# Patient Record
Sex: Female | Born: 1990 | Race: White | Hispanic: No | Marital: Single | Smoking: Current every day smoker
Health system: Southern US, Community
[De-identification: ages and names within clinical notes are randomized; demographics above are authoritative.]

---
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
## 2011-04-15 ENCOUNTER — Observation Stay: Payer: Self-pay | Admitting: Advanced Practice Midwife

## 2011-04-30 ENCOUNTER — Observation Stay: Payer: Self-pay | Admitting: Advanced Practice Midwife

## 2011-05-09 ENCOUNTER — Inpatient Hospital Stay: Payer: Self-pay

## 2016-10-08 ENCOUNTER — Ambulatory Visit (INDEPENDENT_AMBULATORY_CARE_PROVIDER_SITE_OTHER): Payer: Commercial Managed Care - PPO | Admitting: Certified Nurse Midwife

## 2016-10-08 ENCOUNTER — Encounter: Payer: Self-pay | Admitting: Certified Nurse Midwife

## 2016-10-08 VITALS — BP 107/60 | HR 72 | Ht 62.0 in | Wt 178.1 lb

## 2016-10-08 DIAGNOSIS — Z30433 Encounter for removal and reinsertion of intrauterine contraceptive device: Secondary | ICD-10-CM

## 2016-10-08 DIAGNOSIS — Z01419 Encounter for gynecological examination (general) (routine) without abnormal findings: Secondary | ICD-10-CM

## 2016-10-08 DIAGNOSIS — R1031 Right lower quadrant pain: Secondary | ICD-10-CM

## 2016-10-08 DIAGNOSIS — Z7689 Persons encountering health services in other specified circumstances: Secondary | ICD-10-CM

## 2016-10-08 DIAGNOSIS — M545 Low back pain: Secondary | ICD-10-CM

## 2016-10-08 DIAGNOSIS — Z113 Encounter for screening for infections with a predominantly sexual mode of transmission: Secondary | ICD-10-CM

## 2016-10-08 LAB — POCT URINALYSIS DIPSTICK
Bilirubin, UA: NEGATIVE
Blood, UA: NEGATIVE
Glucose, UA: NEGATIVE
KETONES UA: NEGATIVE
Leukocytes, UA: NEGATIVE
Nitrite, UA: NEGATIVE
PH UA: 7
Protein, UA: NEGATIVE
SPEC GRAV UA: 1.01
Urobilinogen, UA: NEGATIVE

## 2016-10-08 LAB — POCT URINE PREGNANCY: Preg Test, Ur: NEGATIVE

## 2016-10-08 NOTE — Patient Instructions (Signed)

## 2016-10-08 NOTE — Progress Notes (Signed)
New pt is here with c/o severe pain in RLQ radiating to back. Has a Mirena in since 2011.LPS 2 yrs.

## 2016-10-08 NOTE — Progress Notes (Signed)
ANNUAL PREVENTATIVE CARE GYN  ENCOUNTER NOTE  Subjective:       Erika Meadows is a 26 y.o. G40P1001 female here for a routine annual gynecologic exam.  Current complaints: right lower quadrant pain and lower back pain x two (2) weeks. She is accompanied to today's visit by her sister.   This pain has increased over the last two weeks, but is manageable with OTC medications and warm baths.   Mirena IUD was placed 06/2010 and pt would like it removed and reinserted today.   She reports a history of an abnormal PAP, but "never followed up about it".   Erika Meadows works full time at [employer] and has a seven (27) year old.   Denies difficulty breathing or respiratory distress, visual changes, chest pain, vaginal bleeding, vaginal itching, unusual vaginal discharge, dysuria, unexplained vaginal bleeding and leg pain or swelling.    Gynecologic History No LMP recorded. Patient is not currently having periods (Reason: IUD).   Contraception: condoms and IUD   Last Pap: 2016. Results were: abnormal  Obstetric History OB History  Gravida Para Term Preterm AB Living  1 1 1     1   SAB TAB Ectopic Multiple Live Births          1    # Outcome Date GA Lbr Len/2nd Weight Sex Delivery Anes PTL Lv  1 Term 2011    M Vag-Spont  N LIV      History reviewed. No pertinent past medical history.  History reviewed. No pertinent surgical history.  No current outpatient prescriptions on file prior to visit.   No current facility-administered medications on file prior to visit.     Not on File  Social History   Social History  . Marital status: Single    Spouse name: N/A  . Number of children: N/A  . Years of education: N/A   Occupational History  . Not on file.   Social History Main Topics  . Smoking status: Never Smoker  . Smokeless tobacco: Never Used  . Alcohol use No  . Drug use: No  . Sexual activity: Yes    Birth control/ protection: IUD   Other Topics Concern  . Not  on file   Social History Narrative  . No narrative on file    Family History  Problem Relation Age of Onset  . Diabetes Maternal Grandmother   . Thyroid disease Maternal Grandmother     The following portions of the patient's history were reviewed and updated as appropriate: allergies, current medications, past family history, past medical history, past social history, past surgical history and problem list.  Review of Systems  ROS negative except for as noted above. Information obtained from patient.    Objective:   BP 107/60   Pulse 72   Ht 5\' 2"  (1.575 m)   Wt 178 lb 2 oz (80.8 kg)   BMI 32.58 kg/m    CONSTITUTIONAL: Well-developed, well-nourished female in no acute distress.   PSYCHIATRIC: Normal mood and affect. Normal behavior. Normal judgment and thought content.  NEUROLGIC: Alert and oriented to person, place, and time. Normal muscle tone coordination. No cranial nerve deficit noted.  HENT:  Normocephalic, atraumatic, External right and left ear normal.   EYES: Conjunctivae and EOM are normal. Pupils are equal, round, and reactive to light.   NECK: Normal range of motion, supple, no masses.  Normal thyroid.   SKIN: Skin is warm and dry. No rash noted. Not diaphoretic. No erythema. No  pallor.  CARDIOVASCULAR: Normal heart rate noted, regular rhythm, no murmur.  RESPIRATORY: Clear to auscultation bilaterally. Effort and breath sounds normal, no problems with respiration noted.  BREASTS: Symmetric in size. No masses, skin changes, nipple drainage, or lymphadenopathy.  ABDOMEN: Soft, normal bowel sounds, no distention noted.  No tenderness, rebound or guarding.   PELVIC:  External Genitalia: Normal  Vagina: Normal  Cervix: Normal  Uterus: Normal  Adnexa: Normal  MUSCULOSKELETAL: Normal range of motion. No tenderness.  No cyanosis, clubbing, or edema.  2+ distal pulses.  LYMPHATIC: No Axillary, Supraclavicular, or Inguinal Adenopathy.  Assessment:    Annual gynecologic examination 26 y.o.   Contraception: IUD   Obesity 1   Problem List Items Addressed This Visit    None    Visit Diagnoses    Well woman exam    -  Primary   Relevant Orders   Basic metabolic panel   CBC   Thyroid Panel With TSH   Vitamin D 1,25 dihydroxy   Hemoglobin A1c   Lipid panel   Right lower quadrant abdominal pain       Acute right-sided low back pain, with sciatica presence unspecified       Screening for STDs (sexually transmitted diseases)       Encounter to establish care       Encounter for IUD removal          Plan:  Pap: Pap, Reflex if ASCUS   Urine dip and NuSwab collected  IUD removal and reinsertion (see note below)  Labs: Lipid 1, FBS, TSH, Hemoglobin A1C and Vit D Level""See orders, pt will return when fasting   Routine preventative health maintenance measures emphasized: Exercise/Diet/Weight control and Safe Sex  RTC x 1 year for annual exam   Gunnar Bulla, CNM   Erika Meadows is a 26 y.o. year old G32P1001 Caucasian female who presents for removal and replacement of a Mirena IUD. She was given informed consent for removal and reinsertion of her Mirena. Her Mirena was placed 06/2010, No LMP recorded. Patient is not currently having periods (Reason: IUD)., and her pregnancy test today was negative.   The risks and benefits of the method and placement have been thouroughly reviewed with the patient and all questions were answered.  Specifically the patient is aware of failure rate of 09/998, expulsion of the IUD and of possible perforation.  The patient is aware of irregular bleeding due to the method and understands the incidence of irregular bleeding diminishes with time.  Signed copy of informed consent in chart.   No LMP recorded. Patient is not currently having periods (Reason: IUD). BP 107/60   Pulse 72   Ht 5\' 2"  (1.575 m)   Wt 178 lb 2 oz (80.8 kg)   BMI 32.58 kg/m  No results found for this or  any previous visit (from the past 24 hour(s)).   Appropriate time out taken. A medium plastic speculum was placed in the vagina.  The cervix was visualized, prepped using Betadine. The strings were visible. They were grasped and the Mirena was easily removed. The cervix was then grasped with a single-tooth tenaculum. The uterus was found to be neutral and it sounded to 7 cm.  Mirena IUD placed per manufacturer's recommendations without complications. The strings were trimmed to 3 cm.  The patient tolerated the procedure well.   The patient was given post procedure instructions, including signs and symptoms of infection and to check for the strings after each  menses or each month, and refraining from intercourse or anything in the vagina for 3 days.  She was given a Mirena care card with date Mirena placed, and date Mirena to be removed.   Gunnar BullaJenkins Michelle Edwyna Meadows, CNM

## 2016-10-09 ENCOUNTER — Other Ambulatory Visit: Payer: Self-pay | Admitting: *Deleted

## 2016-10-09 DIAGNOSIS — Z01419 Encounter for gynecological examination (general) (routine) without abnormal findings: Secondary | ICD-10-CM

## 2016-10-11 ENCOUNTER — Other Ambulatory Visit: Payer: Commercial Managed Care - PPO

## 2016-10-11 DIAGNOSIS — Z01419 Encounter for gynecological examination (general) (routine) without abnormal findings: Secondary | ICD-10-CM

## 2016-10-11 NOTE — Telephone Encounter (Signed)
Mrs Erika Meadows, would you contact the pt and let her know that her PAP was normal. Thanks, JML

## 2016-10-15 LAB — VITAMIN D 1,25 DIHYDROXY
Vitamin D 1, 25 (OH)2 Total: 77 pg/mL
Vitamin D2 1, 25 (OH)2: <10 pg/mL
Vitamin D3 1, 25 (OH)2: 77 pg/mL

## 2016-10-15 LAB — CBC
Hematocrit: 42.2 % (ref 34.0–46.6)
Hemoglobin: 14.2 g/dL (ref 11.1–15.9)
MCH: 31.4 pg (ref 26.6–33.0)
MCHC: 33.6 g/dL (ref 31.5–35.7)
MCV: 93 fL (ref 79–97)
PLATELETS: 225 10*3/uL (ref 150–379)
RBC: 4.52 x10E6/uL (ref 3.77–5.28)
RDW: 13.6 % (ref 12.3–15.4)
WBC: 7.5 10*3/uL (ref 3.4–10.8)

## 2016-10-15 LAB — BASIC METABOLIC PANEL
BUN/Creatinine Ratio: 17 (ref 9–23)
BUN: 11 mg/dL (ref 6–20)
CO2: 22 mmol/L (ref 18–29)
Calcium: 9.6 mg/dL (ref 8.7–10.2)
Chloride: 100 mmol/L (ref 96–106)
Creatinine, Ser: 0.63 mg/dL (ref 0.57–1.00)
GFR calc Af Amer: 145 mL/min/{1.73_m2} (ref >59)
GFR calc non Af Amer: 126 mL/min/{1.73_m2} (ref >59)
GLUCOSE: 95 mg/dL (ref 65–99)
POTASSIUM: 4.2 mmol/L (ref 3.5–5.2)
SODIUM: 138 mmol/L (ref 134–144)

## 2016-10-15 LAB — LIPID PANEL
CHOL/HDL RATIO: 3 ratio (ref 0.0–4.4)
Cholesterol, Total: 163 mg/dL (ref 100–199)
HDL: 54 mg/dL (ref >39)
LDL CALC: 99 mg/dL (ref 0–99)
TRIGLYCERIDES: 51 mg/dL (ref 0–149)
VLDL CHOLESTEROL CAL: 10 mg/dL (ref 5–40)

## 2016-10-15 LAB — THYROID PANEL WITH TSH
FREE THYROXINE INDEX: 4.1 (ref 1.2–4.9)
T3 UPTAKE RATIO: 38 % (ref 24–39)
T4 TOTAL: 10.7 ug/dL (ref 4.5–12.0)
TSH: 0.478 u[IU]/mL (ref 0.450–4.500)

## 2016-10-15 LAB — HEMOGLOBIN A1C
Est. average glucose Bld gHb Est-mCnc: 100 mg/dL
Hgb A1c MFr Bld: 5.1 % (ref 4.8–5.6)

## 2016-11-22 ENCOUNTER — Encounter: Payer: Commercial Managed Care - PPO | Admitting: Certified Nurse Midwife

## 2016-11-23 ENCOUNTER — Ambulatory Visit (INDEPENDENT_AMBULATORY_CARE_PROVIDER_SITE_OTHER): Payer: Commercial Managed Care - PPO | Admitting: Certified Nurse Midwife

## 2016-11-23 ENCOUNTER — Encounter: Payer: Self-pay | Admitting: Certified Nurse Midwife

## 2016-11-23 VITALS — BP 108/67 | HR 100 | Wt 171.5 lb

## 2016-11-23 DIAGNOSIS — Z30431 Encounter for routine checking of intrauterine contraceptive device: Secondary | ICD-10-CM | POA: Diagnosis not present

## 2016-11-23 DIAGNOSIS — M545 Low back pain, unspecified: Secondary | ICD-10-CM

## 2016-11-23 DIAGNOSIS — N898 Other specified noninflammatory disorders of vagina: Secondary | ICD-10-CM

## 2016-11-23 DIAGNOSIS — R300 Dysuria: Secondary | ICD-10-CM

## 2016-11-23 LAB — POCT URINALYSIS DIPSTICK
BILIRUBIN UA: NEGATIVE
Glucose, UA: NEGATIVE
LEUKOCYTES UA: NEGATIVE
Nitrite, UA: NEGATIVE
PH UA: 5 (ref 5.0–8.0)
Spec Grav, UA: 1.025 (ref 1.030–1.035)
Urobilinogen, UA: NEGATIVE (ref ?–2.0)

## 2016-11-23 LAB — POCT URINE PREGNANCY: Preg Test, Ur: NEGATIVE

## 2016-11-23 MED ORDER — NITROFURANTOIN MONOHYD MACRO 100 MG PO CAPS
100.0000 mg | ORAL_CAPSULE | Freq: Two times a day (BID) | ORAL | 1 refills | Status: DC
Start: 2016-11-23 — End: 2020-05-17

## 2016-11-23 NOTE — Patient Instructions (Signed)

## 2016-11-23 NOTE — Progress Notes (Signed)
Pt is here for an IUD string check. 

## 2016-11-26 NOTE — Progress Notes (Signed)
GYN ENCOUNTER NOTE  Subjective:       Erika Meadows is a 26 y.o. G64P1001 female is here for IUD string check and gynecologic evaluation of the following issues: dysuria, low back pain, and vaginal discharge.   Erika Meadows was seen 10/09/2016 for her routine annual exam and IUD removal and reinsertion. She was also evaluated for abdominal pain and back pain at that time.   She reports relief of symptoms for approximately one (1) month. For the last three (3) days she reports intermittent achy low back pain, dysuria, dark urine, and brownish vaginal discharge.   Erika Meadows was concerned that she was pregnancy and took four (4) home pregnancy test that all failed to result. She requests a UPT in office today.   Denies difficulty breathing or respiratory distress, chest pain, vaginal bleeding, and leg pain or swelling.   Gynecologic History  Patient's last menstrual period was 10/13/2016 (approximate).   Contraception: IUD   Last Pap: 10/09/2016. Results were: normal  Obstetric History OB History  Gravida Para Term Preterm AB Living  1 1 1     1   SAB TAB Ectopic Multiple Live Births          1    # Outcome Date GA Lbr Len/2nd Weight Sex Delivery Anes PTL Lv  1 Term 2011    M Vag-Spont  N LIV      History reviewed. No pertinent past medical history.  History reviewed. No pertinent surgical history.  No current outpatient prescriptions on file prior to visit.   No current facility-administered medications on file prior to visit.     Not on File  Social History   Social History  . Marital status: Single    Spouse name: N/A  . Number of children: N/A  . Years of education: N/A   Occupational History  . Not on file.   Social History Main Topics  . Smoking status: Never Smoker  . Smokeless tobacco: Never Used  . Alcohol use No  . Drug use: No  . Sexual activity: Yes    Birth control/ protection: IUD   Other Topics Concern  . Not on file   Social History  Narrative  . No narrative on file    Family History  Problem Relation Age of Onset  . Diabetes Maternal Grandmother   . Thyroid disease Maternal Grandmother     The following portions of the patient's history were reviewed and updated as appropriate: allergies, current medications, past family history, past medical history, past social history, past surgical history and problem list.  Review of Systems  Review of Systems - Negative except as noted above.  History obtained from the patient  Objective:   BP 108/67   Pulse 100   Wt 171 lb 8 oz (77.8 kg)   LMP 10/13/2016 (Approximate) Comment: spotted x 1  BMI 31.37 kg/m    CONSTITUTIONAL: Well-developed, well-nourished female in no acute distress.   ABDOMEN: Soft, non distended; Non tender.  No Organomegaly. No CVAT.   PELVIC:  External Genitalia: Normal  Vagina: Tan discharge present  Cervix: Normal, IUD strings present  Uterus: Normal size, shape,consistency, mobile  Adnexa: Normal  Negative UPT  Urine dipstick shows positive for red blood cells, protein, ketones.  Assessment:   1. Dysuria  - POCT urinalysis dipstick - POCT urine pregnancy - Urine culture  2. Acute midline low back pain without sciatica  - POCT urine pregnancy - Urine culture  3. IUD check up  4. Vaginal discharge  - NuSwab Vaginitis Plus (VG+)  Plan:   Urine culture and NuSwab collected, will contact pt with results  Rx Macrobid, see orders  Reviewed red flag symptoms and when to call including PAINS acronym  RTC if symptoms worsen or fail to improve RTC as needed   Gunnar BullaJenkins Michelle Assata Juncaj, CNM

## 2016-11-28 LAB — URINE CULTURE

## 2016-11-29 ENCOUNTER — Other Ambulatory Visit: Payer: Self-pay | Admitting: Certified Nurse Midwife

## 2016-11-29 LAB — NUSWAB VAGINITIS PLUS (VG+)
Atopobium vaginae: HIGH Score — AB
BVAB 2: HIGH Score — AB
CANDIDA GLABRATA, NAA: NEGATIVE
Candida albicans, NAA: NEGATIVE
Chlamydia trachomatis, NAA: NEGATIVE
Megasphaera 1: HIGH Score — AB
NEISSERIA GONORRHOEAE, NAA: NEGATIVE
TRICH VAG BY NAA: NEGATIVE

## 2016-11-29 MED ORDER — METRONIDAZOLE 500 MG PO TABS: 500.0000 mg | ORAL_TABLET | Freq: Two times a day (BID) | ORAL | 0 refills | Status: AC

## 2017-06-13 ENCOUNTER — Ambulatory Visit (INDEPENDENT_AMBULATORY_CARE_PROVIDER_SITE_OTHER): Payer: Commercial Managed Care - PPO | Admitting: Certified Nurse Midwife

## 2017-06-13 ENCOUNTER — Encounter: Payer: Self-pay | Admitting: Certified Nurse Midwife

## 2017-06-13 VITALS — BP 117/78 | HR 71 | Wt 164.1 lb

## 2017-06-13 DIAGNOSIS — R300 Dysuria: Secondary | ICD-10-CM | POA: Diagnosis not present

## 2017-06-13 DIAGNOSIS — N760 Acute vaginitis: Secondary | ICD-10-CM

## 2017-06-13 DIAGNOSIS — B9689 Other specified bacterial agents as the cause of diseases classified elsewhere: Secondary | ICD-10-CM | POA: Diagnosis not present

## 2017-06-13 LAB — POCT URINALYSIS DIPSTICK
Bilirubin, UA: NEGATIVE
GLUCOSE UA: NEGATIVE
KETONES UA: NEGATIVE
Leukocytes, UA: NEGATIVE
Nitrite, UA: NEGATIVE
PROTEIN UA: NEGATIVE
RBC UA: NEGATIVE
SPEC GRAV UA: 1.015 (ref 1.010–1.025)
UROBILINOGEN UA: 0.2 U/dL
pH, UA: 6.5 (ref 5.0–8.0)

## 2017-06-13 MED ORDER — METRONIDAZOLE 500 MG PO TABS: 500.0000 mg | ORAL_TABLET | Freq: Two times a day (BID) | ORAL | 0 refills | Status: AC

## 2017-06-13 MED ORDER — SECNIDAZOLE 2 G PO PACK: 1.0000 | PACK | Freq: Once | ORAL | 0 refills | Status: AC

## 2017-06-13 NOTE — Patient Instructions (Signed)

## 2017-06-14 NOTE — Progress Notes (Signed)
SUBJECTIVE:   26 y.o. female complains of clear, copious, malodorous, thin and yellow vaginal discharge for two (2) week(s).  Denies abnormal vaginal bleeding or significant pelvic pain or fever. No UTI symptoms. Denies history of known exposure to STD. Denies relief with home treatment measures.   OBJECTIVE:   BP 117/78   Pulse 71   Wt 164 lb 1.6 oz (74.4 kg)   BMI 30.01 kg/m   She appears well, afebrile. Abdomen: benign, soft, nontender, no masses. Pelvic Exam: VULVA: vulvar erythema present, VAGINA: vaginal discharge - white and malodorous, WET MOUNT done - results: KOH done, clue cells. Urine dipstick: negative for all components.  ASSESSMENT:  bacterial vaginosis  PLAN:   Treatment: Flagyl 500 BID x 7 days, abstain from coitus during course of treatment and see orders for additional treatments  RTC x prn if symptoms persist or worsen.   Gunnar Bulla, CNM

## 2017-09-03 HISTORY — PX: BREAST LUMPECTOMY: SHX2

## 2017-09-19 ENCOUNTER — Encounter: Payer: Commercial Managed Care - PPO | Admitting: Certified Nurse Midwife

## 2018-05-27 ENCOUNTER — Other Ambulatory Visit: Payer: Self-pay | Admitting: Student

## 2018-05-27 DIAGNOSIS — N6459 Other signs and symptoms in breast: Secondary | ICD-10-CM

## 2018-05-30 ENCOUNTER — Ambulatory Visit
Admission: RE | Admit: 2018-05-30 | Discharge: 2018-05-30 | Disposition: A | Discharge status: Home / Self Care | Payer: Commercial Managed Care - PPO | Source: Ambulatory Visit | Attending: Student | Admitting: Student

## 2018-05-30 DIAGNOSIS — N6459 Other signs and symptoms in breast: Secondary | ICD-10-CM | POA: Diagnosis not present

## 2018-06-03 ENCOUNTER — Other Ambulatory Visit: Payer: Self-pay | Admitting: Student

## 2018-06-03 ENCOUNTER — Other Ambulatory Visit: Payer: Self-pay

## 2018-06-03 DIAGNOSIS — R928 Other abnormal and inconclusive findings on diagnostic imaging of breast: Secondary | ICD-10-CM

## 2018-06-03 DIAGNOSIS — N6310 Unspecified lump in the right breast, unspecified quadrant: Secondary | ICD-10-CM

## 2018-06-11 ENCOUNTER — Ambulatory Visit
Admission: RE | Admit: 2018-06-11 | Discharge: 2018-06-11 | Disposition: A | Discharge status: Home / Self Care | Payer: Commercial Managed Care - PPO | Source: Ambulatory Visit | Attending: Student | Admitting: Student

## 2018-06-11 DIAGNOSIS — R928 Other abnormal and inconclusive findings on diagnostic imaging of breast: Secondary | ICD-10-CM | POA: Insufficient documentation

## 2018-06-11 DIAGNOSIS — N6310 Unspecified lump in the right breast, unspecified quadrant: Secondary | ICD-10-CM

## 2018-06-13 LAB — SURGICAL PATHOLOGY

## 2020-04-06 ENCOUNTER — Other Ambulatory Visit: Payer: Self-pay | Admitting: Physician Assistant

## 2020-04-06 DIAGNOSIS — S83221A Peripheral tear of medial meniscus, current injury, right knee, initial encounter: Secondary | ICD-10-CM

## 2020-04-27 ENCOUNTER — Ambulatory Visit
Admission: RE | Admit: 2020-04-27 | Discharge: 2020-04-27 | Disposition: A | Discharge status: Home / Self Care | Payer: Commercial Managed Care - PPO | Source: Ambulatory Visit | Attending: Physician Assistant | Admitting: Physician Assistant

## 2020-04-27 ENCOUNTER — Other Ambulatory Visit: Payer: Self-pay

## 2020-04-27 DIAGNOSIS — S83221A Peripheral tear of medial meniscus, current injury, right knee, initial encounter: Secondary | ICD-10-CM

## 2020-05-11 ENCOUNTER — Other Ambulatory Visit: Payer: Self-pay | Admitting: Orthopedic Surgery

## 2020-05-18 ENCOUNTER — Encounter
Admission: RE | Admit: 2020-05-18 | Discharge: 2020-05-18 | Disposition: A | Discharge status: Home / Self Care | Payer: Commercial Managed Care - PPO | Source: Ambulatory Visit | Attending: Orthopedic Surgery | Admitting: Orthopedic Surgery

## 2020-05-18 ENCOUNTER — Other Ambulatory Visit: Payer: Self-pay

## 2020-05-18 NOTE — Patient Instructions (Signed)
Your procedure is scheduled on: May 23, 2020 Monday  Report to Day Surgery on the 2nd floor of the [facility]. To find out your arrival time, please call [phone] between 1PM - 3PM on: May 20, 2020 Friday   REMEMBER: Instructions that are not followed completely may result in serious medical risk, up to and including death; or upon the discretion of your surgeon and anesthesiologist your surgery may need to be rescheduled.  Do not eat food after midnight the night before surgery.  No gum chewing, lozengers or hard candies.  You may however, drink CLEAR liquids up to 2 hours before you are scheduled to arrive for your surgery. Do not drink anything within 2 hours of your scheduled arrival time.  Clear liquids include: - water  - apple juice without pulp - gatorade (not RED) - black coffee or tea (Do NOT add milk or creamers to the coffee or tea) Do NOT drink anything that is not on this list.  Type 1 and Type 2 diabetics should only drink water.  In addition, your doctor has ordered for you to drink the provided  Ensure Pre-Surgery Clear Carbohydrate Drink  Drinking this carbohydrate drink up to two hours before surgery helps to reduce insulin resistance and improve patient outcomes. Please complete drinking 2 hours prior to scheduled arrival time.  TAKE THESE MEDICATIONS THE MORNING OF SURGERY WITH A SIP OF WATER: NONE  One week prior to surgery: Stop Anti-inflammatories (NSAIDS) such as Advil, Aleve, Ibuprofen, Motrin, Naproxen, Naprosyn and Aspirin based products such as Excedrin, Goodys Powder, BC Powder. Stop ANY OVER THE COUNTER supplements until after surgery. (You may continue taking Tylenol, Vitamin D, Vitamin B, and multivitamin.)  No Alcohol for 24 hours before or after surgery.  No Smoking including e-cigarettes for 24 hours prior to surgery.  No chewable tobacco products for at least 6 hours prior to surgery.  No nicotine patches on the day of  surgery.  Do not use any "recreational" drugs for at least a week prior to your surgery.  Please be advised that the combination of cocaine and anesthesia may have negative outcomes, up to and including death. If you test positive for cocaine, your surgery will be cancelled.  On the morning of surgery brush your teeth with toothpaste and water, you may rinse your mouth with mouthwash if you wish. Do not swallow any toothpaste or mouthwash.  Do not wear jewelry, make-up, hairpins, clips or nail polish.  Do not wear lotions, powders, or perfumes.   Do not shave 48 hours prior to surgery.   Contact lenses, hearing aids and dentures may not be worn into surgery.  Do not bring valuables to the hospital. [facility] is not responsible for any missing/lost belongings or valuables.   Use CHG Soap as directed on instruction sheet.  Notify your doctor if there is any change in your medical condition (cold, fever, infection).  Wear comfortable clothing (specific to your surgery type) to the hospital.  Plan for stool softeners for home use; pain medications have a tendency to cause constipation. You can also help prevent constipation by eating foods high in fiber such as fruits and vegetables and drinking plenty of fluids as your diet allows.  After surgery, you can help prevent lung complications by doing breathing exercises.  Take deep breaths and cough every 1-2 hours. Your doctor may order a device called an Incentive Spirometer to help you take deep breaths. When coughing or sneezing, hold a pillow  firmly against your incision with both hands. This is called "splinting." Doing this helps protect your incision. It also decreases belly discomfort.  If you are being discharged the day of surgery, you will not be allowed to drive home. You will need a responsible adult (18 years or older) to drive you home and stay with you that night.   Please call the Pre-admissions Testing Dept. at [phone] if you have any questions about these instructions.  Visitation Policy:  Patients undergoing a surgery or procedure may have one family member or support person with them as long as that person is not COVID-19 positive or experiencing its symptoms.  That person may remain in the waiting area during the procedure.  Inpatient Visitation Update:   In an effort to ensure the safety of our team members and our patients, we are implementing a change to our visitation policy:  Effective Monday, Aug. 9, at 7 a.m., inpatients will be allowed one support person.  o The support person may change daily.  o The support person must pass our screening, gel in and out, and wear a mask at all times, including in the patient's room.  o Patients must also wear a mask when staff or their support person are in the room.  o Masking is required regardless of vaccination status.  Systemwide, no visitors 17 or younger.

## 2020-05-19 ENCOUNTER — Other Ambulatory Visit
Admission: RE | Admit: 2020-05-19 | Discharge: 2020-05-19 | Disposition: A | Discharge status: Home / Self Care | Payer: Commercial Managed Care - PPO | Source: Ambulatory Visit | Attending: Orthopedic Surgery | Admitting: Orthopedic Surgery

## 2020-05-19 DIAGNOSIS — Z01812 Encounter for preprocedural laboratory examination: Secondary | ICD-10-CM | POA: Diagnosis present

## 2020-05-19 DIAGNOSIS — Z20822 Contact with and (suspected) exposure to covid-19: Secondary | ICD-10-CM | POA: Diagnosis not present

## 2020-05-19 LAB — SARS CORONAVIRUS 2 (TAT 6-24 HRS): SARS Coronavirus 2: NEGATIVE

## 2020-05-23 ENCOUNTER — Other Ambulatory Visit: Payer: Self-pay

## 2020-05-23 ENCOUNTER — Ambulatory Visit: Payer: Commercial Managed Care - PPO

## 2020-05-23 ENCOUNTER — Ambulatory Visit: Payer: Commercial Managed Care - PPO | Admitting: Anesthesiology

## 2020-05-23 ENCOUNTER — Ambulatory Visit
Admission: RE | Admit: 2020-05-23 | Discharge: 2020-05-23 | Disposition: A | Discharge status: Home / Self Care | Payer: Commercial Managed Care - PPO | Attending: Orthopedic Surgery | Admitting: Orthopedic Surgery

## 2020-05-23 ENCOUNTER — Encounter: Payer: Self-pay | Admitting: Orthopedic Surgery

## 2020-05-23 ENCOUNTER — Encounter
Admission: RE | Disposition: A | Discharge status: Home / Self Care | Payer: Self-pay | Source: Home / Self Care | Attending: Orthopedic Surgery

## 2020-05-23 DIAGNOSIS — S83281A Other tear of lateral meniscus, current injury, right knee, initial encounter: Secondary | ICD-10-CM | POA: Diagnosis not present

## 2020-05-23 DIAGNOSIS — Y9344 Activity, trampolining: Secondary | ICD-10-CM | POA: Diagnosis not present

## 2020-05-23 DIAGNOSIS — S83241A Other tear of medial meniscus, current injury, right knee, initial encounter: Secondary | ICD-10-CM | POA: Diagnosis not present

## 2020-05-23 DIAGNOSIS — X58XXXA Exposure to other specified factors, initial encounter: Secondary | ICD-10-CM | POA: Insufficient documentation

## 2020-05-23 DIAGNOSIS — F17210 Nicotine dependence, cigarettes, uncomplicated: Secondary | ICD-10-CM | POA: Diagnosis not present

## 2020-05-23 DIAGNOSIS — Z793 Long term (current) use of hormonal contraceptives: Secondary | ICD-10-CM | POA: Diagnosis not present

## 2020-05-23 DIAGNOSIS — S83511A Sprain of anterior cruciate ligament of right knee, initial encounter: Secondary | ICD-10-CM | POA: Insufficient documentation

## 2020-05-23 DIAGNOSIS — Z419 Encounter for procedure for purposes other than remedying health state, unspecified: Secondary | ICD-10-CM

## 2020-05-23 HISTORY — PX: KNEE ARTHROSCOPY WITH ANTERIOR CRUCIATE LIGAMENT (ACL) REPAIR WITH HAMSTRING GRAFT: SHX5645

## 2020-05-23 LAB — POCT PREGNANCY, URINE: Preg Test, Ur: NEGATIVE

## 2020-05-23 SURGERY — KNEE ARTHROSCOPY WITH ANTERIOR CRUCIATE LIGAMENT (ACL) REPAIR WITH HAMSTRING GRAFT
Anesthesia: General | Laterality: Right

## 2020-05-23 MED ORDER — FENTANYL CITRATE (PF) 100 MCG/2ML IJ SOLN
INTRAMUSCULAR | Status: DC | PRN
Start: 2020-05-23 — End: 2020-05-23
  Administered 2020-05-23 (×5): 50 ug via INTRAVENOUS

## 2020-05-23 MED ORDER — DEXMEDETOMIDINE (PRECEDEX) IN NS 20 MCG/5ML (4 MCG/ML) IV SYRINGE
PREFILLED_SYRINGE | INTRAVENOUS | Status: AC
  Filled 2020-05-23: qty 5

## 2020-05-23 MED ORDER — EPINEPHRINE PF 1 MG/ML IJ SOLN
INTRAMUSCULAR | Status: AC
  Filled 2020-05-23: qty 4

## 2020-05-23 MED ORDER — FAMOTIDINE 20 MG PO TABS
20.0000 mg | ORAL_TABLET | Freq: Once | ORAL | Status: AC
  Administered 2020-05-23: 20 mg via ORAL

## 2020-05-23 MED ORDER — LIDOCAINE HCL (PF) 1 % IJ SOLN
INTRAMUSCULAR | Status: AC
  Filled 2020-05-23: qty 5

## 2020-05-23 MED ORDER — ORAL CARE MOUTH RINSE: 15.0000 mL | Freq: Once | OROMUCOSAL | Status: AC

## 2020-05-23 MED ORDER — LACTATED RINGERS IV SOLN: INTRAVENOUS | Status: DC

## 2020-05-23 MED ORDER — DEXMEDETOMIDINE HCL 200 MCG/2ML IV SOLN
INTRAVENOUS | Status: DC | PRN
  Administered 2020-05-23 (×2): 8 ug via INTRAVENOUS
  Administered 2020-05-23: 4 ug via INTRAVENOUS

## 2020-05-23 MED ORDER — ACETAMINOPHEN 10 MG/ML IV SOLN
INTRAVENOUS | Status: AC
  Filled 2020-05-23: qty 100

## 2020-05-23 MED ORDER — MIDAZOLAM HCL 2 MG/2ML IJ SOLN
INTRAMUSCULAR | Status: AC
  Filled 2020-05-23: qty 2

## 2020-05-23 MED ORDER — HYDROMORPHONE HCL 1 MG/ML IJ SOLN
0.5000 mg | INTRAMUSCULAR | Status: AC | PRN
  Administered 2020-05-23 (×2): 0.5 mg via INTRAVENOUS

## 2020-05-23 MED ORDER — VANCOMYCIN HCL 1000 MG IV SOLR
INTRAVENOUS | Status: AC
  Filled 2020-05-23: qty 1000

## 2020-05-23 MED ORDER — FAMOTIDINE 20 MG PO TABS
ORAL_TABLET | ORAL | Status: AC
  Filled 2020-05-23: qty 1

## 2020-05-23 MED ORDER — ROCURONIUM BROMIDE 100 MG/10ML IV SOLN
INTRAVENOUS | Status: DC | PRN
  Administered 2020-05-23: 10 mg via INTRAVENOUS
  Administered 2020-05-23: 20 mg via INTRAVENOUS
  Administered 2020-05-23: 10 mg via INTRAVENOUS
  Administered 2020-05-23: 60 mg via INTRAVENOUS

## 2020-05-23 MED ORDER — CHLORHEXIDINE GLUCONATE 0.12 % MT SOLN
15.0000 mL | Freq: Once | OROMUCOSAL | Status: AC
  Administered 2020-05-23: 15 mL via OROMUCOSAL

## 2020-05-23 MED ORDER — PROPOFOL 10 MG/ML IV BOLUS
INTRAVENOUS | Status: DC | PRN
  Administered 2020-05-23: 150 mg via INTRAVENOUS

## 2020-05-23 MED ORDER — ONDANSETRON HCL 4 MG/2ML IJ SOLN
INTRAMUSCULAR | Status: DC | PRN
  Administered 2020-05-23: 4 mg via INTRAVENOUS

## 2020-05-23 MED ORDER — DEXAMETHASONE SODIUM PHOSPHATE 10 MG/ML IJ SOLN
INTRAMUSCULAR | Status: DC | PRN
  Administered 2020-05-23: 10 mg via INTRAVENOUS

## 2020-05-23 MED ORDER — ACETAMINOPHEN 10 MG/ML IV SOLN
INTRAVENOUS | Status: DC | PRN
  Administered 2020-05-23: 1000 mg via INTRAVENOUS

## 2020-05-23 MED ORDER — BUPIVACAINE HCL (PF) 0.5 % IJ SOLN
INTRAMUSCULAR | Status: AC
  Filled 2020-05-23: qty 30

## 2020-05-23 MED ORDER — FENTANYL CITRATE (PF) 100 MCG/2ML IJ SOLN
INTRAMUSCULAR | Status: AC
  Filled 2020-05-23: qty 2

## 2020-05-23 MED ORDER — LIDOCAINE HCL (PF) 2 % IJ SOLN
INTRAMUSCULAR | Status: AC
  Filled 2020-05-23: qty 5

## 2020-05-23 MED ORDER — HYDROMORPHONE HCL 1 MG/ML IJ SOLN
INTRAMUSCULAR | Status: AC
  Administered 2020-05-23: 0.5 mg via INTRAVENOUS
  Filled 2020-05-23: qty 1

## 2020-05-23 MED ORDER — ROPIVACAINE HCL 5 MG/ML IJ SOLN
INTRAMUSCULAR | Status: AC
  Filled 2020-05-23: qty 30

## 2020-05-23 MED ORDER — BUPIVACAINE LIPOSOME 1.3 % IJ SUSP
INTRAMUSCULAR | Status: AC
  Filled 2020-05-23: qty 20

## 2020-05-23 MED ORDER — ASPIRIN EC 325 MG PO TBEC: 325.0000 mg | DELAYED_RELEASE_TABLET | Freq: Every day | ORAL | 0 refills | Status: AC

## 2020-05-23 MED ORDER — VANCOMYCIN HCL 1000 MG IV SOLR
INTRAVENOUS | Status: DC | PRN
  Administered 2020-05-23: 1000 mg via TOPICAL

## 2020-05-23 MED ORDER — ROPIVACAINE HCL 5 MG/ML IJ SOLN
INTRAMUSCULAR | Status: DC | PRN
  Administered 2020-05-23: 30 mL via PERINEURAL

## 2020-05-23 MED ORDER — LIDOCAINE-EPINEPHRINE 1 %-1:100000 IJ SOLN
INTRAMUSCULAR | Status: AC
  Filled 2020-05-23: qty 1

## 2020-05-23 MED ORDER — IBUPROFEN 800 MG PO TABS: 800.0000 mg | ORAL_TABLET | Freq: Three times a day (TID) | ORAL | 1 refills | Status: AC

## 2020-05-23 MED ORDER — PROPOFOL 10 MG/ML IV BOLUS
INTRAVENOUS | Status: AC
  Filled 2020-05-23: qty 20

## 2020-05-23 MED ORDER — LIDOCAINE HCL (CARDIAC) PF 100 MG/5ML IV SOSY
PREFILLED_SYRINGE | INTRAVENOUS | Status: DC | PRN
  Administered 2020-05-23: 50 mg via INTRAVENOUS

## 2020-05-23 MED ORDER — ROCURONIUM BROMIDE 10 MG/ML (PF) SYRINGE
PREFILLED_SYRINGE | INTRAVENOUS | Status: AC
  Filled 2020-05-23: qty 10

## 2020-05-23 MED ORDER — FENTANYL CITRATE (PF) 100 MCG/2ML IJ SOLN
25.0000 ug | INTRAMUSCULAR | Status: DC | PRN
  Administered 2020-05-23 (×4): 25 ug via INTRAVENOUS

## 2020-05-23 MED ORDER — ONDANSETRON HCL 4 MG/2ML IJ SOLN
INTRAMUSCULAR | Status: AC
  Administered 2020-05-23: 4 mg via INTRAVENOUS
  Filled 2020-05-23: qty 2

## 2020-05-23 MED ORDER — DEXAMETHASONE SODIUM PHOSPHATE 10 MG/ML IJ SOLN
INTRAMUSCULAR | Status: AC
  Filled 2020-05-23: qty 1

## 2020-05-23 MED ORDER — ACETAMINOPHEN 500 MG PO TABS: 1000.0000 mg | ORAL_TABLET | Freq: Three times a day (TID) | ORAL | 2 refills | Status: AC

## 2020-05-23 MED ORDER — CEFAZOLIN SODIUM-DEXTROSE 2-4 GM/100ML-% IV SOLN
INTRAVENOUS | Status: AC
  Filled 2020-05-23: qty 100

## 2020-05-23 MED ORDER — OXYCODONE HCL 5 MG PO TABS: 5.0000 mg | ORAL_TABLET | ORAL | 0 refills | Status: AC | PRN

## 2020-05-23 MED ORDER — FENTANYL CITRATE (PF) 100 MCG/2ML IJ SOLN
INTRAMUSCULAR | Status: AC
  Administered 2020-05-23: 25 ug via INTRAVENOUS
  Filled 2020-05-23: qty 2

## 2020-05-23 MED ORDER — ONDANSETRON HCL 4 MG/2ML IJ SOLN
INTRAMUSCULAR | Status: AC
  Filled 2020-05-23: qty 2

## 2020-05-23 MED ORDER — CHLORHEXIDINE GLUCONATE 0.12 % MT SOLN
OROMUCOSAL | Status: AC
  Filled 2020-05-23: qty 15

## 2020-05-23 MED ORDER — ONDANSETRON HCL 4 MG/2ML IJ SOLN: 4.0000 mg | Freq: Once | INTRAMUSCULAR | Status: AC | PRN

## 2020-05-23 MED ORDER — SEVOFLURANE IN SOLN
RESPIRATORY_TRACT | Status: AC
  Filled 2020-05-23: qty 250

## 2020-05-23 MED ORDER — LIDOCAINE HCL (PF) 1 % IJ SOLN
INTRAMUSCULAR | Status: DC | PRN
  Administered 2020-05-23: 3 mL via SUBCUTANEOUS

## 2020-05-23 MED ORDER — MIDAZOLAM HCL 2 MG/2ML IJ SOLN
INTRAMUSCULAR | Status: DC | PRN
  Administered 2020-05-23: 2 mg via INTRAVENOUS

## 2020-05-23 MED ORDER — ONDANSETRON 4 MG PO TBDP: 4.0000 mg | ORAL_TABLET | Freq: Three times a day (TID) | ORAL | 0 refills | Status: AC | PRN

## 2020-05-23 MED ORDER — CEFAZOLIN SODIUM-DEXTROSE 2-4 GM/100ML-% IV SOLN
2.0000 g | INTRAVENOUS | Status: AC
  Administered 2020-05-23: 2 g via INTRAVENOUS

## 2020-05-23 SURGICAL SUPPLY — 85 items
ADAPTER IRRIG TUBE 2 SPIKE SOL (ADAPTER) ×6 IMPLANT
ADPR TBG 2 SPK PMP STRL ASCP (ADAPTER) ×2
ANCH SUT SWLK 19.1X4.75 VT (Anchor) ×1 IMPLANT
ANCHOR BUTTON TIGHTROPE RN 14 (Anchor) ×3 IMPLANT
ANCHOR PEEK 4.75X19.1 SWLK C (Anchor) ×3 IMPLANT
APL PRP STRL LF DISP 70% ISPRP (MISCELLANEOUS) ×1
BASIN GRAD PLASTIC 32OZ STRL (MISCELLANEOUS) ×3 IMPLANT
BLADE SURG 15 STRL LF DISP TIS (BLADE) ×2 IMPLANT
BLADE SURG 15 STRL SS (BLADE) ×6
BLADE SURG SZ10 CARB STEEL (BLADE) ×3 IMPLANT
BLADE SURG SZ11 CARB STEEL (BLADE) ×3 IMPLANT
BNDG COHESIVE 4X5 TAN STRL (GAUZE/BANDAGES/DRESSINGS) ×3 IMPLANT
BNDG COHESIVE 6X5 TAN STRL LF (GAUZE/BANDAGES/DRESSINGS) ×3 IMPLANT
BNDG ESMARK 6X12 TAN STRL LF (GAUZE/BANDAGES/DRESSINGS) ×3 IMPLANT
BRUSH SCRUB EZ  4% CHG (MISCELLANEOUS) ×2
BRUSH SCRUB EZ 4% CHG (MISCELLANEOUS) ×1 IMPLANT
BUR BR 5.5 12 FLUTE (BURR) IMPLANT
BUR RADIUS 4.0X18.5 (BURR) ×3 IMPLANT
CHLORAPREP W/TINT 26 (MISCELLANEOUS) ×3 IMPLANT
CLEANER CAUTERY TIP 5X5 PAD (MISCELLANEOUS) ×1 IMPLANT
CLOSURE WOUND 1/2 X4 (GAUZE/BANDAGES/DRESSINGS) ×1
COOLER POLAR GLACIER W/PUMP (MISCELLANEOUS) ×3 IMPLANT
COVER BACK TABLE REUSABLE LG (DRAPES) ×3 IMPLANT
COVER WAND RF STERILE (DRAPES) ×3 IMPLANT
CUFF TOURN SGL QUICK 24 (TOURNIQUET CUFF)
CUFF TOURN SGL QUICK 30 (TOURNIQUET CUFF)
CUFF TRNQT CYL 24X4X16.5-23 (TOURNIQUET CUFF) IMPLANT
CUFF TRNQT CYL 30X4X21-28X (TOURNIQUET CUFF) IMPLANT
DEVICE MENISCAL CVD UP (Anchor) ×12 IMPLANT
DRAPE 3/4 80X56 (DRAPES) ×6 IMPLANT
DRAPE FLUOR MINI C-ARM 54X84 (DRAPES) ×3 IMPLANT
DRAPE POUCH INSTRU U-SHP 10X18 (DRAPES) ×6 IMPLANT
DRAPE SPLIT 6X30 W/TAPE (DRAPES) ×3 IMPLANT
ELECT REM PT RETURN 9FT ADLT (ELECTROSURGICAL) ×3
ELECTRODE REM PT RTRN 9FT ADLT (ELECTROSURGICAL) ×1 IMPLANT
GAUZE SPONGE 4X4 12PLY STRL (GAUZE/BANDAGES/DRESSINGS) ×3 IMPLANT
GAUZE XEROFORM 1X8 LF (GAUZE/BANDAGES/DRESSINGS) ×3 IMPLANT
GLOVE BIOGEL PI IND STRL 8 (GLOVE) ×1 IMPLANT
GLOVE BIOGEL PI INDICATOR 8 (GLOVE) ×2
GLOVE SURG SYN 8.0 (GLOVE) ×3 IMPLANT
GOWN STRL REUS W/ TWL LRG LVL3 (GOWN DISPOSABLE) ×1 IMPLANT
GOWN STRL REUS W/ TWL XL LVL3 (GOWN DISPOSABLE) ×1 IMPLANT
GOWN STRL REUS W/TWL LRG LVL3 (GOWN DISPOSABLE) ×3
GOWN STRL REUS W/TWL XL LVL3 (GOWN DISPOSABLE) ×3
GRADUATE 1200CC STRL 31836 (MISCELLANEOUS) ×3 IMPLANT
GUIDEWIRE 1.2MMX18 (WIRE) ×3 IMPLANT
HANDLE YANKAUER SUCT BULB TIP (MISCELLANEOUS) ×3 IMPLANT
IMP SYS 2ND FIX PEEK 4.75X19.1 (Miscellaneous) ×3 IMPLANT
IMPL SYS 2ND FX PEEK 4.75X19.1 (Miscellaneous) ×1 IMPLANT
IMPL SYS MENISCAL ROOT REPAIR (Orthopedic Implant) ×3 IMPLANT
IMPL TIGHTROP ABS ACL FIBERTG (Orthopedic Implant) ×1 IMPLANT
IMPL TIGHTROP FIBERTAG ACL (Orthopedic Implant) ×1 IMPLANT
IMPL TIGHTROPE ABS ACL FIBERTG (Orthopedic Implant) ×3 IMPLANT
IMPLANT TIGHTROPE FIBERTAG ACL (Orthopedic Implant) ×3 IMPLANT
IV LACTATED RINGER IRRG 3000ML (IV SOLUTION) ×18
IV LR IRRIG 3000ML ARTHROMATIC (IV SOLUTION) ×6 IMPLANT
KIT TURNOVER KIT A (KITS) ×3 IMPLANT
MANIFOLD NEPTUNE II (INSTRUMENTS) ×3 IMPLANT
MAT ABSORB  FLUID 56X50 GRAY (MISCELLANEOUS) ×4
MAT ABSORB FLUID 56X50 GRAY (MISCELLANEOUS) ×2 IMPLANT
NEEDLE HYPO 22GX1.5 SAFETY (NEEDLE) ×3 IMPLANT
PACK ARTHROSCOPY KNEE (MISCELLANEOUS) ×3 IMPLANT
PAD ABD DERMACEA PRESS 5X9 (GAUZE/BANDAGES/DRESSINGS) ×6 IMPLANT
PAD CLEANER CAUTERY TIP 5X5 (MISCELLANEOUS) ×2
PAD WRAPON POLAR KNEE (MISCELLANEOUS) ×1 IMPLANT
PENCIL ELECTRO HAND CTR (MISCELLANEOUS) ×3 IMPLANT
SET TUBE SUCT SHAVER OUTFL 24K (TUBING) ×3 IMPLANT
SET TUBE TIP INTRA-ARTICULAR (MISCELLANEOUS) ×3 IMPLANT
SPONGE LAP 18X18 RF (DISPOSABLE) ×6 IMPLANT
STRIP CLOSURE SKIN 1/2X4 (GAUZE/BANDAGES/DRESSINGS) ×2 IMPLANT
SUCTION FRAZIER HANDLE 10FR (MISCELLANEOUS)
SUCTION TUBE FRAZIER 10FR DISP (MISCELLANEOUS) IMPLANT
SUT ETHILON 3-0 FS-10 30 BLK (SUTURE) ×3
SUT FIBERWIRE #2 38 T-5 BLUE (SUTURE) ×6
SUT MNCRL AB 4-0 PS2 18 (SUTURE) ×3 IMPLANT
SUT VIC AB 0 CT1 36 (SUTURE) ×3 IMPLANT
SUT VIC AB 2-0 CT1 27 (SUTURE) ×3
SUT VIC AB 2-0 CT1 TAPERPNT 27 (SUTURE) ×1 IMPLANT
SUTURE EHLN 3-0 FS-10 30 BLK (SUTURE) ×1 IMPLANT
SUTURE FIBERWR #2 38 T-5 BLUE (SUTURE) ×2 IMPLANT
SYR BULB IRRIG 60ML STRL (SYRINGE) ×3 IMPLANT
TRAY FOLEY SLVR 16FR LF STAT (SET/KITS/TRAYS/PACK) ×3 IMPLANT
TUBING ARTHRO INFLOW-ONLY STRL (TUBING) ×3 IMPLANT
WAND WEREWOLF FLOW 90D (MISCELLANEOUS) IMPLANT
WRAPON POLAR PAD KNEE (MISCELLANEOUS) ×3

## 2020-05-23 NOTE — Anesthesia Procedure Notes (Signed)
Anesthesia Regional Block: Adductor canal block   Pre-Anesthetic Checklist: ,, timeout performed, Correct Patient, Correct Site, Correct Laterality, Correct Procedure, Correct Position, site marked, Risks and benefits discussed,  Surgical consent,  Pre-op evaluation,  At surgeon's request and post-op pain management  Laterality: Right  Prep: chloraprep       Needles:  Injection technique: Single-shot  Needle Type: Echogenic Stimulator Needle     Needle Length: 10cm  Needle Gauge: 20     Additional Needles:   Procedures:, nerve stimulator,,, ultrasound used (permanent image in chart),,,,  Narrative:  Start time: 05/23/2020 1:03 PM End time: 05/23/2020 1:08 PM Injection made incrementally with aspirations every 5 mL.  Performed by: Personally   Additional Notes: Functioning IV was confirmed and monitors were applied. Sterile prep and drape,hand hygiene and sterile gloves were used.  Negative aspiration and negative test dose prior to incremental administration of local anesthetic. The patient tolerated the procedure well. Minimal pain in knee at the 5 minute mark. ja

## 2020-05-23 NOTE — Anesthesia Procedure Notes (Signed)
Procedure Name: Intubation Performed by: Omer Jack, CRNA Pre-anesthesia Checklist: Patient identified, Patient being monitored, Timeout performed, Emergency Drugs available and Suction available Patient Re-evaluated:Patient Re-evaluated prior to induction Oxygen Delivery Method: Circle system utilized Preoxygenation: Pre-oxygenation with 100% oxygen Induction Type: IV induction Ventilation: Mask ventilation without difficulty Laryngoscope Size: 3 and McGraph Grade View: Grade I Tube type: Oral Tube size: 7.0 mm Number of attempts: 1 Airway Equipment and Method: Stylet and Video-laryngoscopy Placement Confirmation: ETT inserted through vocal cords under direct vision,  positive ETCO2 and breath sounds checked- equal and bilateral Secured at: 21 cm Tube secured with: Tape Dental Injury: Teeth and Oropharynx as per pre-operative assessment

## 2020-05-23 NOTE — H&P (Signed)
Paper H&P to be scanned into permanent record. H&P reviewed. No significant changes noted.  

## 2020-05-23 NOTE — Anesthesia Preprocedure Evaluation (Signed)
Anesthesia Evaluation  Patient identified by MRN, date of birth, ID band Patient awake    Reviewed: Allergy & Precautions, H&P , NPO status , Patient's Chart, lab work & pertinent test results, reviewed documented beta blocker date and time   Airway Mallampati: II  TM Distance: >3 FB Neck ROM: full    Dental  (+) Teeth Intact   Pulmonary neg pulmonary ROS, Current Smoker,    Pulmonary exam normal        Cardiovascular Exercise Tolerance: Good negative cardio ROS Normal cardiovascular exam Rhythm:regular Rate:Normal     Neuro/Psych negative neurological ROS  negative psych ROS   GI/Hepatic negative GI ROS, Neg liver ROS,   Endo/Other  negative endocrine ROS  Renal/GU negative Renal ROS  negative genitourinary   Musculoskeletal   Abdominal   Peds  Hematology negative hematology ROS (+)   Anesthesia Other Findings History reviewed. No pertinent past medical history. Past Surgical History: 2019: BREAST LUMPECTOMY; Right   Reproductive/Obstetrics negative OB ROS                             Anesthesia Physical Anesthesia Plan  ASA: II  Anesthesia Plan: General ETT   Post-op Pain Management:  Regional for Post-op pain   Induction:   PONV Risk Score and Plan:   Airway Management Planned:   Additional Equipment:   Intra-op Plan:   Post-operative Plan:   Informed Consent: I have reviewed the patients History and Physical, chart, labs and discussed the procedure including the risks, benefits and alternatives for the proposed anesthesia with the patient or authorized representative who has indicated his/her understanding and acceptance.     Dental Advisory Given  Plan Discussed with: CRNA  Anesthesia Plan Comments:         Anesthesia Quick Evaluation

## 2020-05-23 NOTE — Anesthesia Procedure Notes (Addendum)
Procedure Name: Intubation Date/Time: 05/23/2020 7:47 AM Performed by: Omer Jack, CRNA Pre-anesthesia Checklist: Patient identified, Patient being monitored, Timeout performed, Emergency Drugs available and Suction available Patient Re-evaluated:Patient Re-evaluated prior to induction Oxygen Delivery Method: Circle system utilized Preoxygenation: Pre-oxygenation with 100% oxygen Induction Type: IV induction Ventilation: Mask ventilation without difficulty Laryngoscope Size: 3 and McGraph Grade View: Grade I Tube type: Oral Tube size: 7.0 mm Number of attempts: 1 Airway Equipment and Method: Stylet Placement Confirmation: ETT inserted through vocal cords under direct vision,  positive ETCO2 and breath sounds checked- equal and bilateral Secured at: 22 cm Tube secured with: Tape Dental Injury: Teeth and Oropharynx as per pre-operative assessment

## 2020-05-23 NOTE — Op Note (Addendum)
Operative Note    SURGERY DATE: 05/23/2020   PRE-OP DIAGNOSIS:  1. Right knee anterior cruciate ligament tear 2. Right medial meniscus tear (RAMP lesion)    POST-OP DIAGNOSIS:  1. Right knee anterior cruciate ligament tear 2. Right medial meniscus tear (RAMP lesion) 3. Right lateral meniscus root tear  PROCEDURES:  1. Right knee anterior cruciate ligament reconstruction with quadriceps tendon autograft 2. Right medial meniscus repair (RAMP lesion undersurface of posterior horn 3. Right lateral meniscus repair (posterior horn root tear)   SURGEON: Rosealee Albee, MD  ASSISTANT: Dedra Skeens, PA   ANESTHESIA: Gen   ESTIMATED BLOOD LOSS: 25cc   TOTAL IV FLUIDS: per anesthesia  INDICATION(S):  JACKEE GLASNER is a 29 y.o. female who initially had a skiing injury approximately 1 year ago but did not obtain a formal orthopedic evaluation.  Then, approximately 2 months ago she was on a trampoline when she lost her balance and felt multiple popping sensations in her knee.  She noted a large effusion and difficulty weightbearing afterwards.  MRI showed an ACL tear and possible medial meniscus ramp lesion which was consistent with the clinical exam.  We discussed risks of surgery including but not limited to possible ACL and/or meniscus re-tear, infection, bleeding, muscle/nerve damage, DVT, complications of anesthesia, and postoperative knee pain and arthrofibrosis.  The patient understands the postoperative limitations with meniscus repair and rehabilitation involved.  After discussion of risks, benefits, and alternatives to surgery, the patient and family elected to proceed.  After discussion of risks, benefits, and alternatives to surgery, the patient elected to proceed.     OPERATIVE FINDINGS:    Examination under anesthesia: A careful examination under anesthesia was performed.  Passive range of motion was: Hyperextension: 2.  Extension: 0.  Flexion: 130.  Lachman: 2B. Pivot Shift:  grade 2.  Posterior drawer: normal.  Varus stability in full extension: normal.  Varus stability in 30 degrees of flexion: normal.  Valgus stability in full extension: normal.  Valgus stability in 30 degrees of flexion: normal.   Intra-operative findings: A thorough arthroscopic examination of the knee was performed.  The findings are: 1. Suprapatellar pouch: Normal 2. Undersurface of median ridge: Normal 3. Medial patellar facet: Normal 4. Lateral patellar facet: Normal 5. Trochlea: Normal 6. Lateral gutter/popliteus tendon: Normal 7. Hoffa's fat pad: Normal 8. Medial gutter/plica: Normal 9. ACL: Abnormal: complete femoral avulsion 10. PCL: Normal 11. Medial meniscus: undersurface vertical tear of the entire posterior horn at the periphery 12. Medial compartment cartilage: Normal 13. Lateral meniscus: Posterior horn complete meniscus root tear without any attachment to the tibia 14. Lateral compartment cartilage: Mild grade 1 impaction injury of the lateral femoral condyle   OPERATIVE REPORT:    I identified Farrah E Foor in the pre-operative holding area.  I marked the operative knee with my initials. I reviewed the risks and benefits of the proposed surgical intervention and the patient (and/or patient's guardian) wished to proceed. The patient was transferred to the operative suite and placed in the supine position with all bony prominences padded.    Appropriate anesthesia was administered. Appropriate IV antibiotics were administered within 30 minutes of incision. The extremity was then prepped and draped in standard fashion. A time out was performed confirming the correct extremity, correct patient and correct procedure.   Given the clear presence of a pivot shift on examination under anesthesia, I first directed my attention to the harvest of a quadriceps autograft.  The right lower extremity was exsanguinated  with an Esmarch, and a thigh tourniquet was elevated to 250 mmHg.  The  total tourniquet time for this case was 135 minutes.     A 4cm incision was planned just proximal to the proximal pole of the patella.  The incision was made with a 15 blade, and subcutaneous fat was sharply excised to expose the quadriceps tendon.  A speculum retractor was placed anteriorly, and the quadriceps was easily visualized with the arthroscope.  The vastus lateralis and VMO were clearly identified, as was the junction of the rectus femoris muscle with the proximal aspect of the quadriceps tendon.  Under direct visualization with the arthroscope, an Arthrex 10 mm parallel blade was used to incise the quadriceps tendon from its most proximal extent, to the junction with the patella.  Care was taken not to violate the rectus femoris muscle.  Then, using a 15 blade, the graft was transected and elevated distally off the patella, creating a 7 mm thick partial thickness graft.  Dissection was carried proximally to create a uniformly thick graft, 70 mm in length.  The distal end of the graft was controlled with a #2 Fiberwire stitch, and the Arthrex quadriceps harvester/cutter was loaded over the graft.  At a length of 70 mm, the harvest/cutter was used to transect the graft proximally, and the graft was removed from the wound.  The arthroscope was used to confirm a partial thickness harvest with no violation of the anterior knee capsule.     On the back table, the graft was prepared in standard fashion.  The length of the graft was 70 mm.  Each end was prepared using an Warden/ranger.  The femoral end was secured around a TightRope RT, and the tibial end was secured around an ABS loop.  The femoral end of the graft was 62mm in diameter, the tibial end was 9.44mm in diameter.  The graft was placed in a graft tube and tensioned to 20 lbs and reserved for later use.  Of note, it was soaked in 5 mg/mL vancomycin solution to reduce risk of infection for at least 20 minutes prior to implantation.    Standard anterolateral portals was created with an 11-blade.  The arthroscope was introduced through the anterolateral portal, and a full diagnostic arthroscopy was performed as described above.   An anteromedial portal was made under needle localization. A shaver was introduced through the anteromedial portal and used to gently debride the fat pad to improve visualization. Then the ACL remnant was debrided using the shaver, leaving 1-2 mm stumps on the tibia for anatomic referencing.  A gentle notchplasty of the lateral wall of the intercondylar notch was performed.  The medial meniscus was addressed next. A rasp was used to roughen the capsular tissue about the meniscus as well as to clear soft tissue from the tear itself.  A shaver was also used in a similar fashion.  A total of 4 [employer]ker IVY Air all-inside suture devices were used to repair the meniscus tear. These were all placed on the tibial side as the tear was evident on the undersurface of the meniscus. The tear was probed afterwards and found to be stable and anatomically reduced.   Next, I created the femoral socket. This was performed with an outside-in technique using an Teacher, English as a foreign language. The femoral guide was placed on the anatomic footprint of the ACL, and the guide indicated where the lateral femoral incision should be. We made this incision with an 11 blade and  followed the angle of the drill sleeve to make the incision through the IT band down to bone. The drill sleeve was pushed down to bone on the lateral femoral condyle and the guide was placed on the anatomic footprint of the ACL. We drilled a 58mm tunnel that was 13mm in length. We then used a FiberStick to pass a suture through the femoral tunnel and out of the anteromedial portal.    I then directed my attention to preparation of the tibial tunnel. A tibial guide set at 60 degrees was inserted through the anteromedial portal and centered over the tibial footprint.  The drill sleeve  was then advanced to the proximal medial tibia just at the junction of the tibial tubercle and the pes tendon, through a ~4cm incision.  The anticipated tunnel length was 20mm.  A guide pin was then drilled through the proximal tibia under direct arthroscopic visualization into the center of the ACL footprint.  This was then over-reamed with an Arthrex 9.12mm reamer.  Bony debris and soft tissue was cleared from the metaphyseal opening with electrocautery and from the intra-articular aperture with a shaver.   Next, a flip cutter was used using an MU guide to drill the lateral meniscus tunnel.  This was brought in through the anteromedial portal.  The tunnel was confluent with the ACL tunnel that had been drilled previously.  The guide was placed in the region of the anatomic location of the posterior horn of the lateral meniscus.  The cutter was advanced and and approximately 10 mm socket was retrodrilled with a 6 mm flip cutter.  Passing suture was placed and pulled out of the anteromedial portal.  Next, a meniscal scorpion was used to place two #2 stitches through the posterior horn of the lateral meniscus adjacent to the root.  These were passed in a luggage tag fashion.  There were then shuttled through the socket created using the passing stitch.  The passing suture for the ACL was then brought out of the tibial tunnel.  The graft was then advanced into place in standard fashion.  The femoral Tight Rope was deployed on the lateral cortex under direct arthroscopic visualization from the anteromedial portal.  Correct position on the lateral cortex was confirmed fluoroscopically.  The TightRope was then shortened until at least 20 mm of graft was in the femoral tunnel.     I then directed my attention to tibial fixation.  This was performed with the knee in full extension with an axial and posterior drawer load applied to the tibia.  A 27mm ABS button was loaded over the ABS loop, and the loop was shortened  until the button was flush with the anteromedial tibial cortex. The knee was then cycled 20 times, and both the femoral and tibial button were tightened as much as possible with the knee in full extension. The tibial sutures were tightened, and a knot was placed over the button.  A hole for a 4.75 mm SwiveLock was drilled approximately 2 cm distal to the tibial tunnel.  The ends of the tibial sutures were placed under tension and the anchor was advanced.  This served as a back-up tibial fixation.   Next, the lateral meniscus root repair stitches were passed through a 4.75 mm SwiveLock.  Similar to the back of fixation, a hole approximately 2 cm distal and more anterior to the prior anchor was drilled.  The ends of the meniscus root repair sutures were placed under tension, and the  lateral meniscus root was visualized arthroscopically while the SwiveLock anchor was advanced into place.  The lateral meniscus was confirmed to be stable with probing.  A repeat examination under anesthesia was performed.  The patient retained full hyperextension and can easily flex to 90 degrees.  The Lachman's was normalized.  The arthroscope was re-introduced into the knee joint, confirming excellent position and tension of the quadriceps autograft.  There was no lateral wall or roof impingement.  Tibial and femoral sutures were cut.   The wounds were irrigated. 2-0 Vicryl was used to close the quadriceps paratenon.  The FiberWire from the ACL backup fixation anchor as well as 0 Vicryl was used to close the sartorius fascia.  2-0 Vicryl was used to close the subdermal layers of both the quadriceps tendon harvest and the tibial tunnel incisions.  The harvest incision and the proximal medial tibial incisions were then closed with 4-0 Monocryl and Dermabond.  The arthroscopy portals and lateral femoral incision were closed with 3-0 Nylon.  A sterile dressing was applied, followed by a Polar Care device and a hinged knee brace locked  in full extension.   The patient was awakened from anesthesia without difficulty and was transferred to the PACU in stable condition.   Of note, assistance from a PA was essential to performing the surgery.  PA was present for the entire surgery.  PA assisted with patient positioning, retraction, instrumentation, and wound closure. The surgery would have been more difficult and had longer operative time without PA assistance.   Additionally, this case had increased complexity compared to standard meniscus repairs given that the patient had a complete tear of the meniscus root. Repair of this tear involved drilling a tunnel through the tibia and using an implant in the tibia for fixation, which would otherwise not occur for standard meniscal repairs.  The steps increased surgical time by approximately 30 minutes.   POSTOPERATIVE PLAN: The patient will be discharged home today once they meet PACU criteria. NWB x 4 weeks.  Use rehab protocol for ACL reconstruction with quadriceps tendon autograft with lateral meniscus root repair. Start physical therapy on POD#3-4. Aspirin 325 mg daily for 4 weeks for DVT prophylaxis.Follow up in 2 weeks per protocol.

## 2020-05-23 NOTE — Discharge Instructions (Addendum)
Arthroscopic ACL Surgery with Meniscus Repair   Post-Op Instructions   1. Bracing or crutches: Crutches will be provided at the time of discharge from the surgery center.    2. Ice: You may be provided with a device [facility]) that allows you to ice the affected area effectively. Otherwise you can ice manually.   3. Driving:  Driving: Off all narcotic pain meds when operating vehicle   1 week for automatic cars, left leg surgery  4 weeks for standard/manual cars or right leg surgery   4. Activity: Ankle pumps several times an hour while awake to prevent blood clots. Weight bearing: NO WEIGHT BEARING is permitted x 4 weeks. The brace should not be unlocked in order to protect the meniscus repair. Unlock only for hygiene and for exercises as directed by physical therapist. Elevate knee above heart level as much as possible for one week. Avoid standing more than 5 minutes (consecutively) for the first week. No exercise involving the knee until cleared by the surgeon or physical therapist. Ideally, you should avoid long distance travel for 4 weeks.   5. Medications:  - You have been provided a prescription for narcotic pain medicine. After surgery, take 1-2 narcotic tablets every 4 hours if needed for severe pain.  - A prescription for anti-nausea medication will be provided in case the narcotic medicine causes nausea - take 1 tablet every 6 hours only if nauseated.  - Take ibuprofen 800 mg every 8 hours with food to reduce post-operative knee swelling. DO NOT STOP IBUPROFEN POST-OP UNTIL INSTRUCTED TO DO SO at first post-op office visit (10-14 days after surgery).  - Take enteric coated aspirin 325 mg once daily for 4 weeks to prevent blood clots.  - Take tylenol 1000mg  (two extra strength tablets) every 8 hours for pain.  May stop tylenol 5 days after surgery if you are having minimal pain. - You can take an over the counter stool softener to help with narcotic related constipation (Colace,  Senna, Miralax, etc.)   If you are taking prescription medication for anxiety, depression, insomnia, muscle spasm, chronic pain, or for attention deficit disorder you are advised that you are at a higher risk of adverse effects with use of narcotics post-op, including narcotic addiction/dependence, depressed breathing, death. If you use non-prescribed substances: alcohol, marijuana, cocaine, heroin, methamphetamines, etc., you are at a higher risk of adverse effects with use of narcotics post-op, including narcotic addiction/dependence, depressed breathing, death. You are advised that taking > 50 morphine milligram equivalents (MME) of narcotic pain medication per day results in twice the risk of overdose or death. For your prescription provided: oxycodone 5 mg - taking more than 6 tablets per day. Be advised that we will prescribe narcotics short-term, for acute post-operative pain only - 1 week for minor operations such as knee arthroscopy for meniscus tear resection, and 3 weeks for major operations such as knee repair/reconstruction surgeries.   6. Bandages: The physical therapist should change the bandages at the first post-op appointment. If needed, the dressing supplies have been provided to you.   7. Physical Therapy: 2 times per week for the first 4 weeks, then 1-2 times per week from weeks 4-8 post-op. Therapy typically starts on post operative Day 3 or 4. You have been provided an order for physical therapy. The therapist will provide home exercises.   8. Work/School: May return when able to tolerate standing for greater than 2 hours and off of narcotic pain medications. Can return to  school usually in ~1-2 weeks.    9. Post-Op Appointments: Your first post-op appointment will be with Dr. Allena Katz in approximately 2 weeks time.    If you find that they have not been scheduled please call the Orthopaedic Appointment front desk at [phone].   AMBULATORY SURGERY  DISCHARGE  INSTRUCTIONS   1) The drugs that you were given will stay in your system until tomorrow so for the next 24 hours you should not:  A) Drive an automobile B) Make any legal decisions C) Drink any alcoholic beverage   2) You may resume regular meals tomorrow.  Today it is better to start with liquids and gradually work up to solid foods.  You may eat anything you prefer, but it is better to start with liquids, then soup and crackers, and gradually work up to solid foods.   3) Please notify your doctor immediately if you have any unusual bleeding, trouble breathing, redness and pain at the surgery site, drainage, fever, or pain not relieved by medication.    4) Additional Instructions:        Please contact your physician with any problems or Same Day Surgery at [phone], Monday through Friday 6 am to 4 pm, or [place] at [facility] number at [phone].   Bupivacaine Liposomal Suspension for Injection What is this medicine? BUPIVACAINE LIPOSOMAL (bue PIV a kane LIP oh som al) is an anesthetic. It causes loss of feeling in the skin or other tissues. It is used to prevent and to treat pain from some procedures. This medicine may be used for other purposes; ask your health care provider or pharmacist if you have questions. COMMON BRAND NAME(S): EXPAREL What should I tell my health care provider before I take this medicine? They need to know if you have any of these conditions:  G6PD deficiency  heart disease  kidney disease  liver disease  low blood pressure  lung or breathing disease, like asthma  an unusual or allergic reaction to bupivacaine, other medicines, foods, dyes, or preservatives  pregnant or trying to get pregnant  breast-feeding How should I use this medicine? This medicine is for injection into the affected area. It is given by a health care professional in a hospital or clinic setting. Talk to your pediatrician regarding the use of this  medicine in children. Special care may be needed. Overdosage: If you think you have taken too much of this medicine contact a poison control center or emergency room at once. NOTE: This medicine is only for you. Do not share this medicine with others. What if I miss a dose? This does not apply. What may interact with this medicine? This medicine may interact with the following medications:  acetaminophen  certain antibiotics like dapsone, nitrofurantoin, aminosalicylic acid, sulfonamides  certain medicines for seizures like phenobarbital, phenytoin, valproic acid  chloroquine  cyclophosphamide  flutamide  hydroxyurea  ifosfamide  metoclopramide  nitric oxide  nitroglycerin  nitroprusside  nitrous oxide  other local anesthetics like lidocaine, pramoxine, tetracaine  primaquine  quinine  rasburicase  sulfasalazine This list may not describe all possible interactions. Give your health care provider a list of all the medicines, herbs, non-prescription drugs, or dietary supplements you use. Also tell them if you smoke, drink alcohol, or use illegal drugs. Some items may interact with your medicine. What should I watch for while using this medicine? Your condition will be monitored carefully while you are receiving this medicine. Be careful to avoid injury while the area  is numb, and you are not aware of pain. What side effects may I notice from receiving this medicine? Side effects that you should report to your doctor or health care professional as soon as possible:  allergic reactions like skin rash, itching or hives, swelling of the face, lips, or tongue  seizures  signs and symptoms of a dangerous change in heartbeat or heart rhythm like chest pain; dizziness; fast, irregular heartbeat; palpitations; feeling faint or lightheaded; falls; breathing problems  signs and symptoms of methemoglobinemia such as pale, gray, or blue colored skin; headache; fast heartbeat;  shortness of breath; feeling faint or lightheaded, falls; tiredness Side effects that usually do not require medical attention (report to your doctor or health care professional if they continue or are bothersome):  anxious  back pain  changes in taste  changes in vision  constipation  dizziness  fever  nausea, vomiting This list may not describe all possible side effects. Call your doctor for medical advice about side effects. You may report side effects to FDA at 1-800-FDA-1088. Where should I keep my medicine? This drug is given in a hospital or clinic and will not be stored at home. NOTE: This sheet is a summary. It may not cover all possible information. If you have questions about this medicine, talk to your doctor, pharmacist, or health care provider.  2020 Elsevier/Gold Standard (2019-06-02 10:48:23)

## 2020-05-23 NOTE — Transfer of Care (Signed)
Immediate Anesthesia Transfer of Care Note  Patient: Erika Meadows  Procedure(s) Performed: Right arthroscopic ACL reconstruction using quadriceps tendon autograft, possible medial meniscus (RAMP) repair, possible chondroplasty (Right )  Patient Location: PACU  Anesthesia Type:General  Level of Consciousness: drowsy and patient cooperative  Airway & Oxygen Therapy: Patient Spontanous Breathing and Patient connected to face mask oxygen  Post-op Assessment: Report given to RN and Post -op Vital signs reviewed and stable  Post vital signs: Reviewed and stable  Last Vitals:  Vitals Value Taken Time  BP 132/80 05/23/20 1132  Temp    Pulse 70 05/23/20 1132  Resp 18 05/23/20 1132  SpO2 100 % 05/23/20 1132  Vitals shown include unvalidated device data.  Last Pain:  Vitals:   05/23/20 1128  TempSrc:   PainSc: (P) 0-No pain         Complications: No complications documented.

## 2020-05-24 ENCOUNTER — Encounter: Payer: Self-pay | Admitting: Orthopedic Surgery

## 2020-05-24 NOTE — Anesthesia Postprocedure Evaluation (Signed)
Anesthesia Post Note  Patient: Erika Meadows  Procedure(s) Performed: Right arthroscopic ACL reconstruction using quadriceps tendon autograft, possible medial meniscus (RAMP) repair, possible chondroplasty (Right )  Patient location during evaluation: PACU Anesthesia Type: General Level of consciousness: awake and alert Pain management: pain level controlled Vital Signs Assessment: post-procedure vital signs reviewed and stable Respiratory status: spontaneous breathing, nonlabored ventilation, respiratory function stable and patient connected to nasal cannula oxygen Cardiovascular status: blood pressure returned to baseline and stable Postop Assessment: no apparent nausea or vomiting Anesthetic complications: no   No complications documented.   Last Vitals:  Vitals:   05/23/20 1353 05/23/20 1414  BP: 118/65 (!) 114/56  Pulse: 74 68  Resp: 16 16  Temp: (!) 36.1 C (!) 36.2 C  SpO2: 95% 100%    Last Pain:  Vitals:   05/23/20 1414  TempSrc: Temporal  PainSc: 0-No pain                 Yevette Edwards

## 2020-05-27 ENCOUNTER — Encounter: Payer: Self-pay | Admitting: Orthopedic Surgery

## 2020-06-29 ENCOUNTER — Encounter: Payer: Self-pay | Admitting: Orthopedic Surgery

## 2021-02-14 ENCOUNTER — Encounter: Payer: Commercial Managed Care - PPO | Admitting: Certified Nurse Midwife

## 2021-03-03 ENCOUNTER — Encounter: Payer: Self-pay | Admitting: Certified Nurse Midwife

## 2021-05-04 IMAGING — MR MR KNEE*R* W/O CM
7 series · 40 of 40 positions shown · non-contrast
Comparison: None.

CLINICAL DATA: Right knee pain since the patient fell jumping on a
trampoline 1-2 months ago. Initial encounter.

EXAM:
MRI OF THE RIGHT KNEE WITHOUT CONTRAST
TECHNIQUE: Multiplanar, multisequence MR imaging of the knee was performed. No
intravenous contrast was administered.

[Series 9: T2 fat-sat · coronal · right · 4.0mm · 0.59mm/px · 7 of 30 slices shown (1 of 3)]
[im 1/30]
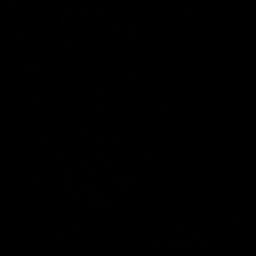
[im 5/30]
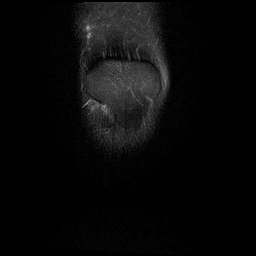
[im 10/30]
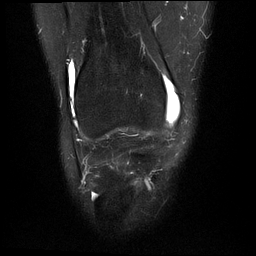
[im 15/30]
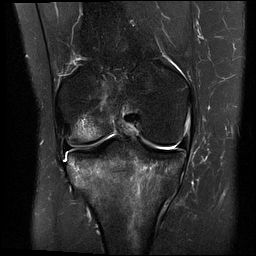
[im 20/30]
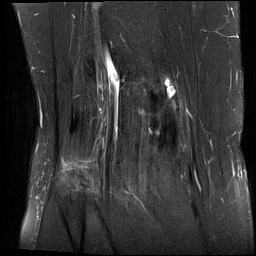
[im 25/30]
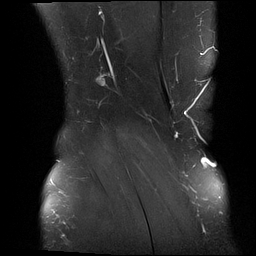
[im 30/30]
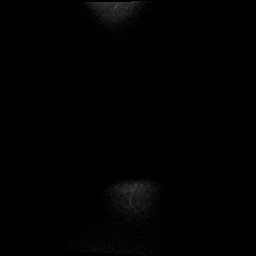

[Series 10: T1 · coronal · right · 4.0mm · 0.59mm/px · 6 of 30 slices shown]
[im 1/30]
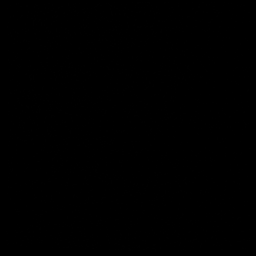
[im 6/30]
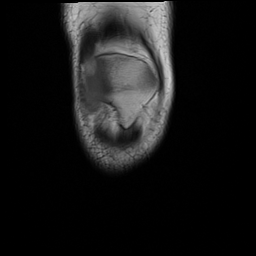
[im 12/30]
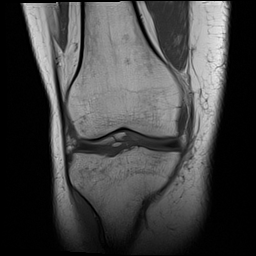
[im 18/30]
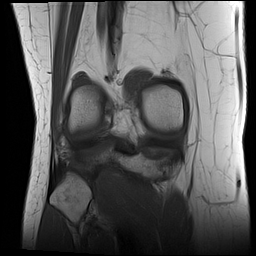
[im 24/30]
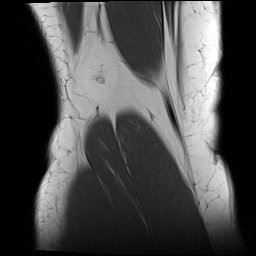
[im 30/30]
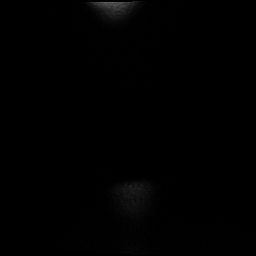

[Series 11: PD fat-sat · coronal · right · 4.0mm · 0.59mm/px · 6 of 30 slices shown (1 of 2)]
[im 1/30]
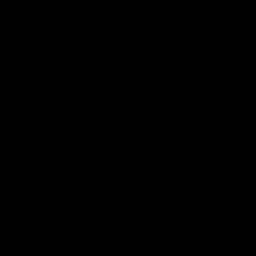
[im 6/30]
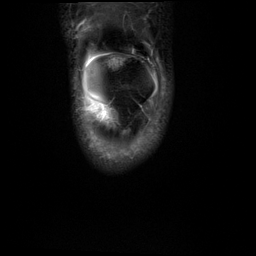
[im 12/30]
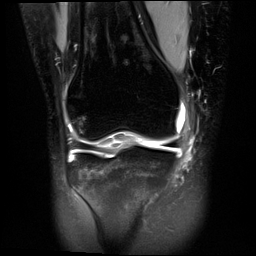
[im 18/30]
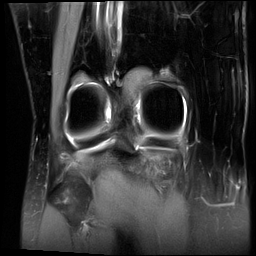
[im 24/30]
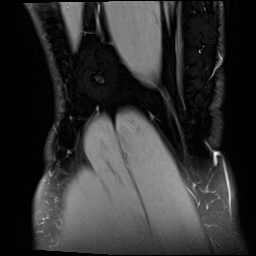
[im 30/30]
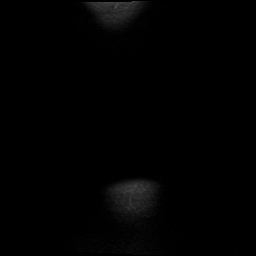

[Series 12: PD fat-sat · sagittal · right · 3.0mm · 0.59mm/px · 6 of 31 slices shown (2 of 2)]
[im 1/31]
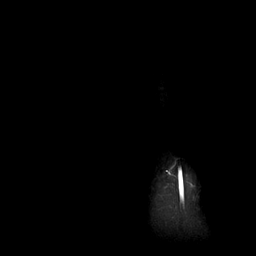
[im 7/31]
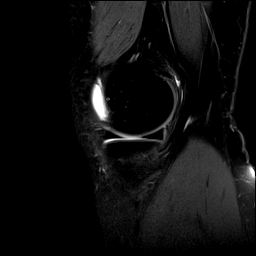
[im 13/31]
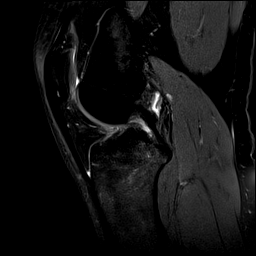
[im 19/31]
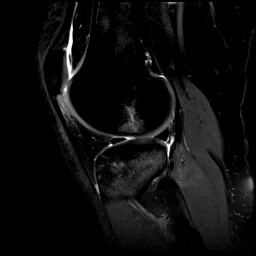
[im 25/31]
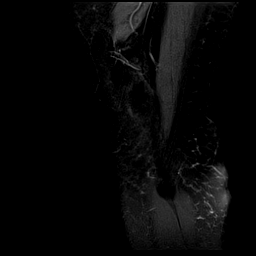
[im 31/31]
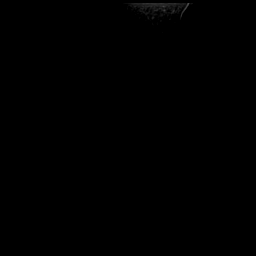

[Series 13: T2 fat-sat · sagittal · right · 3.0mm · 0.59mm/px · 7 of 35 slices shown (2 of 3)]
[im 1/35]
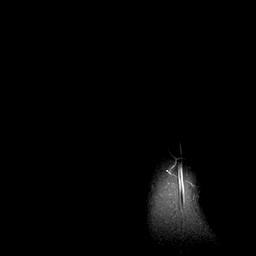
[im 6/35]
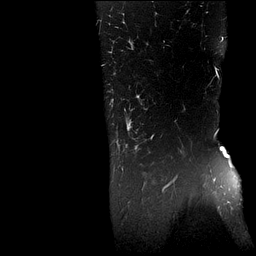
[im 12/35]
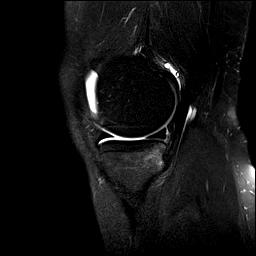
[im 18/35]
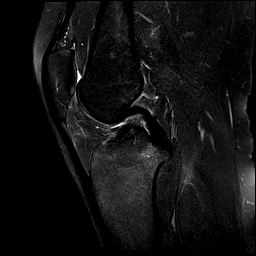
[im 23/35]
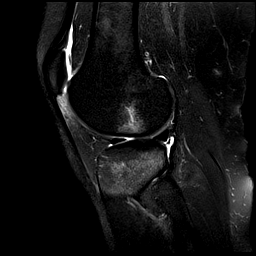
[im 29/35]
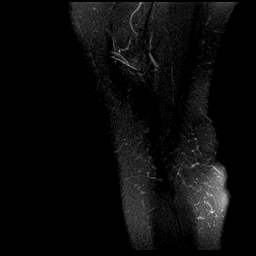
[im 35/35]
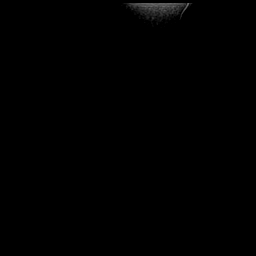

[Series 14: T2 fat-sat · axial · right · 4.0mm · 0.50mm/px · z∈[-80,+45]mm · 5 of 26 slices shown (3 of 3)]
[im 1/26]
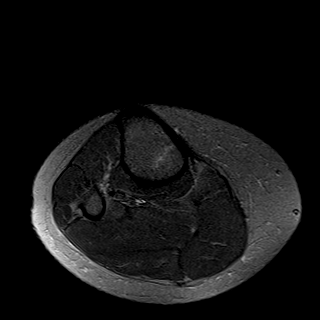
[im 7/26]
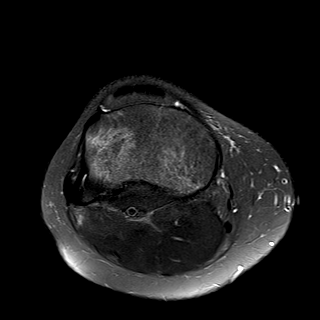
[im 13/26]
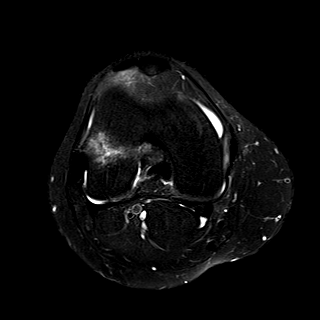
[im 19/26]
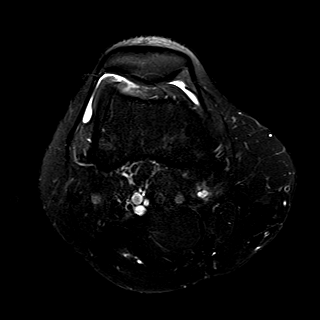
[im 26/26]
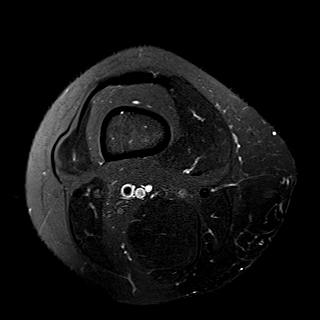

[Series 15: PD · oblique · right · 2.0mm · 0.47mm/px · 3 of 16 slices shown]
[im 1/16]
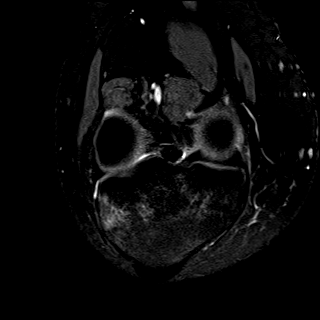
[im 8/16]
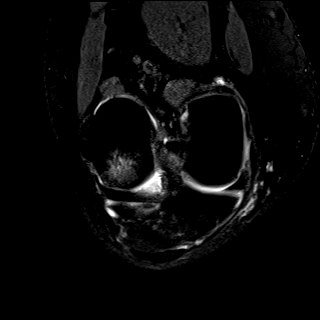
[im 16/16]
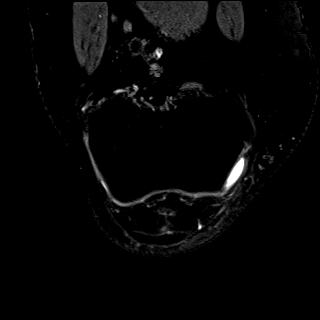

[40 of 40 positions shown; findings below may reference images not displayed]

FINDINGS: MENISCI

Medial meniscus:  Intact.

Lateral meniscus:  Intact.

LIGAMENTS

Cruciates:  The ACL is completely torn.  The PCL is intact.

Collaterals:  Intact.

CARTILAGE

Patellofemoral:  Normal.

Medial:  Normal.

Lateral:  Normal.

Joint:  Small effusion.

Popliteal Fossa:  Tiny Baker's cyst.

Extensor Mechanism:  Intact.

Bones: Bone contusions are seen in the lateral femoral condyle,
posterior tibia and fibular head. No fracture.

Other: None.
IMPRESSION: Complete ACL tear with associated bone contusions and joint
effusion. Otherwise negative.

## 2021-08-02 ENCOUNTER — Ambulatory Visit: Payer: Self-pay
